# Patient Record
Sex: Female | Born: 1955 | Race: White | Hispanic: No | Marital: Single
Health system: Southern US, Community
[De-identification: ages and names within clinical notes are randomized; demographics above are authoritative.]

---

## 2007-05-24 ENCOUNTER — Ambulatory Visit: Payer: Self-pay | Admitting: Unknown Physician Specialty

## 2009-02-19 ENCOUNTER — Ambulatory Visit: Payer: Self-pay | Admitting: Unknown Physician Specialty

## 2009-05-23 ENCOUNTER — Ambulatory Visit: Payer: Self-pay | Admitting: Gastroenterology

## 2009-06-17 ENCOUNTER — Ambulatory Visit: Payer: Self-pay | Admitting: Gastroenterology

## 2010-04-20 ENCOUNTER — Ambulatory Visit: Payer: Self-pay | Admitting: Unknown Physician Specialty

## 2011-05-13 ENCOUNTER — Ambulatory Visit: Payer: Self-pay | Admitting: Unknown Physician Specialty

## 2011-05-25 ENCOUNTER — Ambulatory Visit: Payer: Self-pay | Admitting: Unknown Physician Specialty

## 2011-06-28 ENCOUNTER — Ambulatory Visit: Payer: Self-pay | Admitting: Surgery

## 2011-07-14 ENCOUNTER — Ambulatory Visit: Payer: Self-pay | Admitting: Surgery

## 2011-07-20 ENCOUNTER — Ambulatory Visit: Payer: Self-pay | Admitting: Surgery

## 2011-07-21 LAB — PATHOLOGY REPORT

## 2012-05-17 ENCOUNTER — Ambulatory Visit: Payer: Self-pay | Admitting: Gastroenterology

## 2012-07-04 ENCOUNTER — Ambulatory Visit: Payer: Self-pay | Admitting: Gastroenterology

## 2012-07-22 IMAGING — MG MMM DGTL SCREENING MAMMO W/CAD
1 series · 5 of 5 positions shown · non-contrast
Comparison: none

REASON FOR EXAM: SCR MAMMO
COMMENTS:

[Series 204: R CC · right · 5 of 5 slices shown]
[im 1/5]
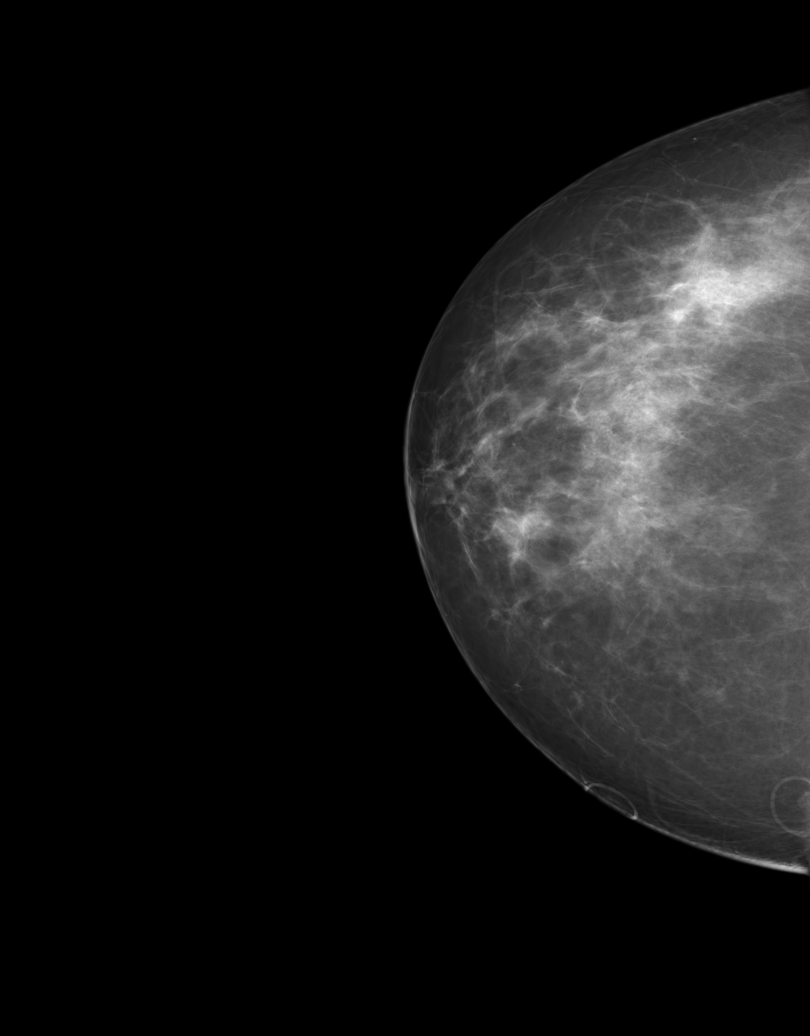
[im 2/5]
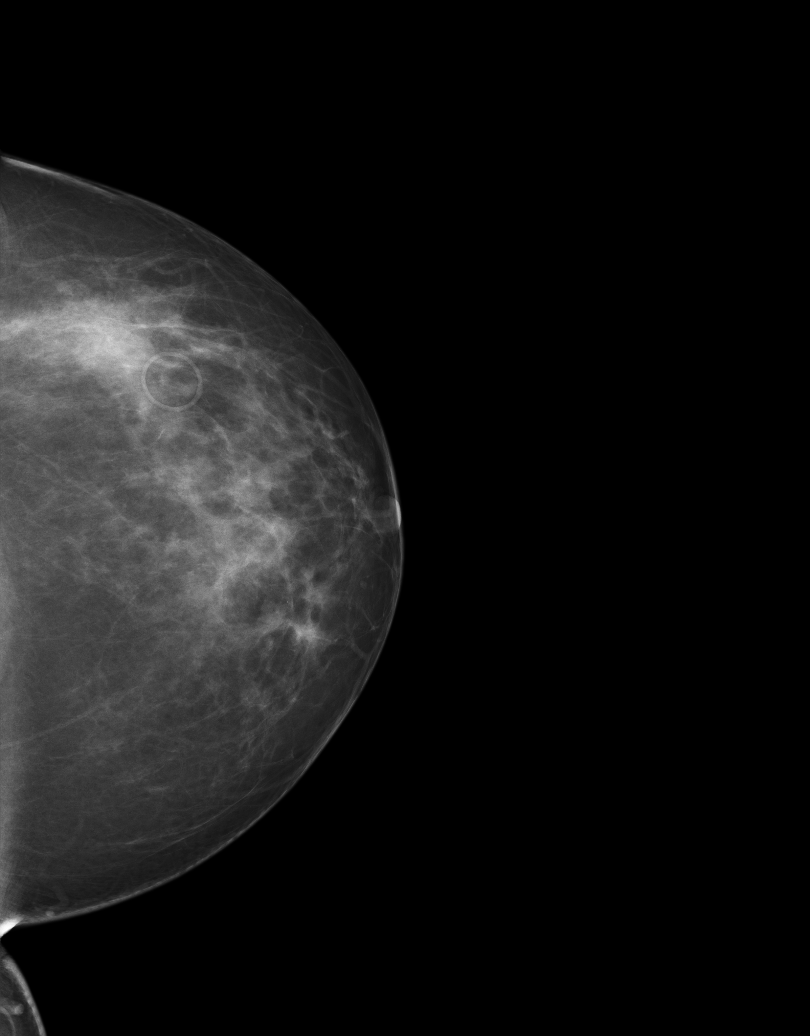
[im 3/5]
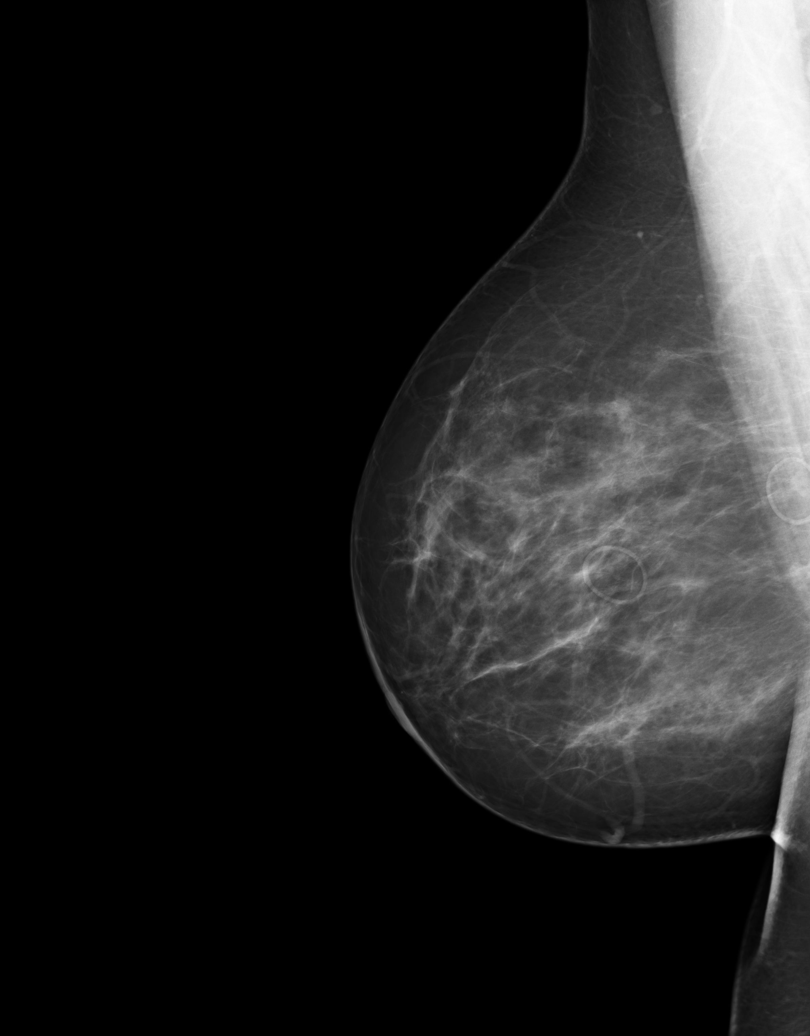
[im 4/5]
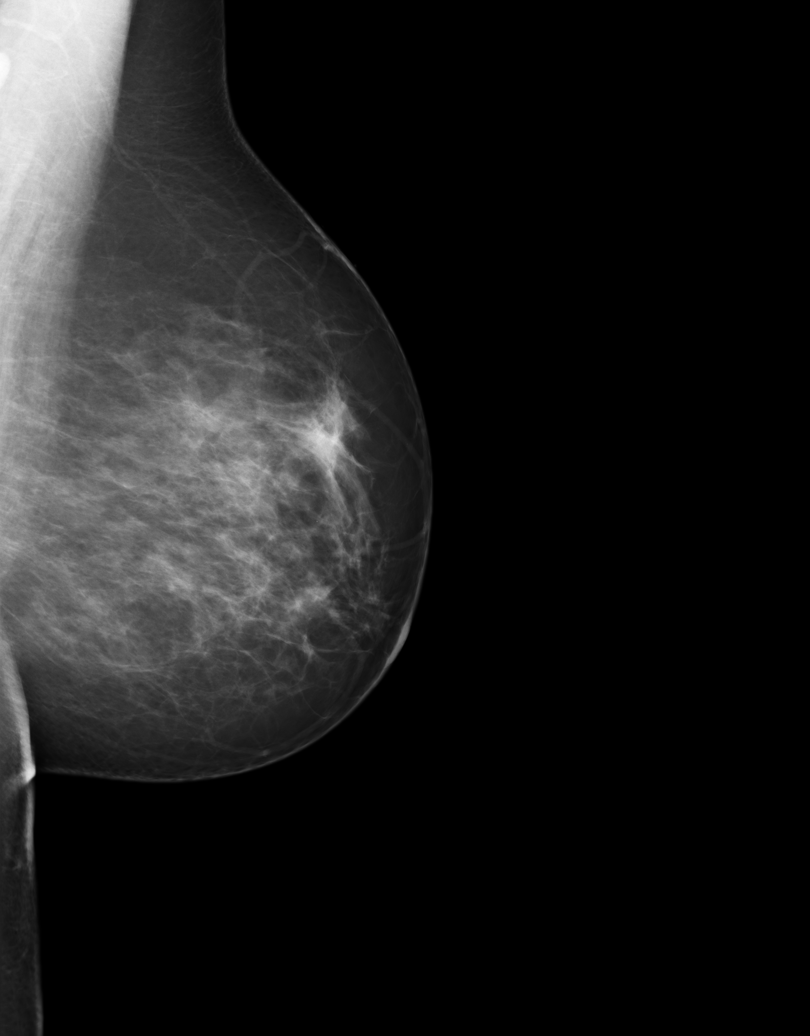
[im 5/5]
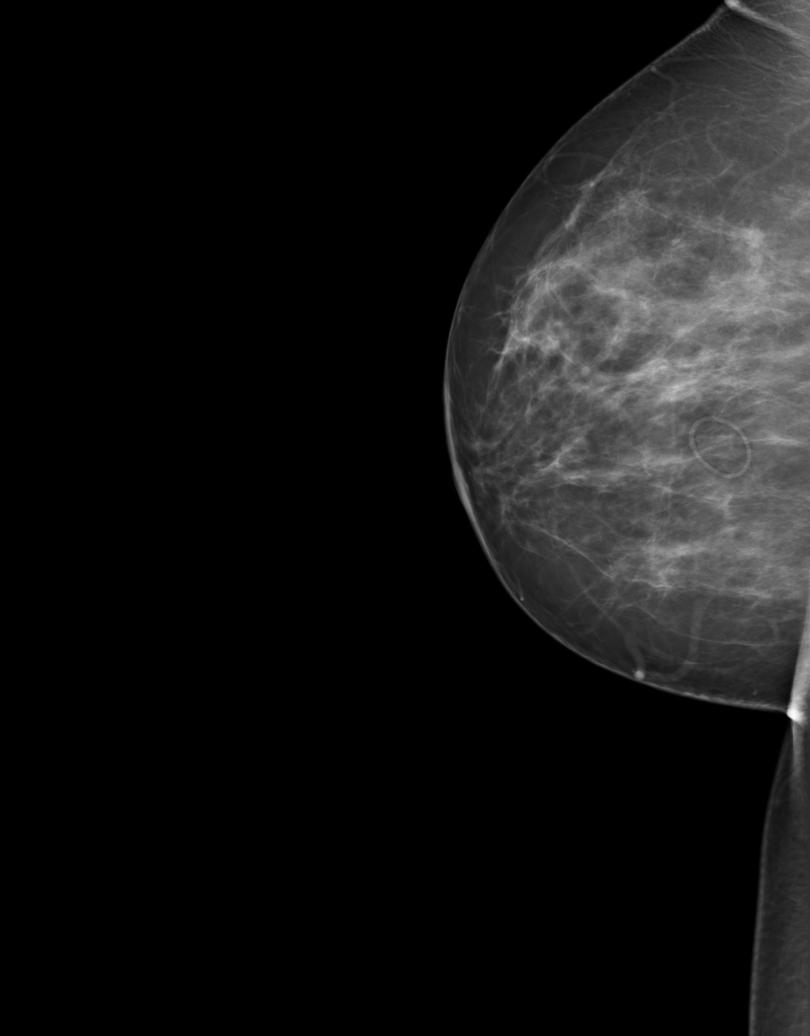

[5 of 5 positions shown; findings below may reference images not displayed]

PROCEDURE:     MMM - MMM DGTL SCREENING MAMMO W/CAD  - May 13, 2011  [DATE]

RESULT:     Comparison is made to prior examinations of 04/20/2010, 02/19/2009
and 05/24/2007.

There is a focal asymmetry superiorly in the right breast approximately 11
cm from the nipple. A few, faint calcifications are also seen in the region.
A corresponding focus is not seen in the CC view. There are; however, noted
a few calcifications laterally in the CC view of the right breast. It is
recommended the patient return for spot magnification views of the upper
outer portion of the right breast. Additionally, an ML view and a spot
magnification ML view is recommended. No new mass or calcification is
identified on the left.
IMPRESSION: 1.  There is noted asymmetric density superiorly in the right breast for
which additional imaging is recommended as noted above.
2.  BI-RADS: Category 0 - Needs Additional Imaging Evaluation (Needs
additional imaging evaluation of the right breast).

Thank you for this opportunity to contribute to the care of your patient.

A NEGATIVE MAMMOGRAM REPORT DOES NOT PRECLUDE BIOPSY OR OTHER EVALUATION OF
A CLINICALLY PALPABLE OR OTHERWISE SUSPICIOUS MASS OR LESION. BREAST CANCER
MAY NOT BE DETECTED BY MAMMOGRAPHY IN UP TO 10% OF CASES.

## 2012-08-18 ENCOUNTER — Ambulatory Visit: Payer: Self-pay | Admitting: Unknown Physician Specialty

## 2013-06-04 ENCOUNTER — Ambulatory Visit: Payer: Self-pay | Admitting: Gastroenterology

## 2013-11-14 ENCOUNTER — Ambulatory Visit: Payer: Self-pay | Admitting: Gastroenterology

## 2013-12-17 ENCOUNTER — Ambulatory Visit: Payer: Self-pay | Admitting: Unknown Physician Specialty

## 2013-12-25 ENCOUNTER — Ambulatory Visit: Payer: Self-pay | Admitting: Unknown Physician Specialty

## 2013-12-31 ENCOUNTER — Ambulatory Visit: Payer: Self-pay | Admitting: Unknown Physician Specialty

## 2014-01-03 ENCOUNTER — Ambulatory Visit: Payer: Self-pay | Admitting: Gastroenterology

## 2014-01-03 LAB — CREATININE, SERUM
Creatinine: 0.95 mg/dL (ref 0.60–1.30)
EGFR (African American): >60

## 2014-01-08 LAB — PATHOLOGY REPORT

## 2014-01-21 ENCOUNTER — Ambulatory Visit: Payer: Self-pay | Admitting: Hematology and Oncology

## 2014-01-24 ENCOUNTER — Ambulatory Visit: Payer: Self-pay | Admitting: Surgery

## 2014-01-29 ENCOUNTER — Ambulatory Visit: Payer: Self-pay | Admitting: Gastroenterology

## 2014-02-01 ENCOUNTER — Ambulatory Visit: Payer: Self-pay | Admitting: Surgery

## 2014-02-04 LAB — PATHOLOGY REPORT

## 2014-02-11 LAB — COMPREHENSIVE METABOLIC PANEL
AST: 31 U/L (ref 15–37)
Albumin: 3.6 g/dL (ref 3.4–5.0)
Alkaline Phosphatase: 136 U/L — ABNORMAL HIGH
Anion Gap: 11 (ref 7–16)
BILIRUBIN TOTAL: 0.2 mg/dL (ref 0.2–1.0)
BUN: 8 mg/dL (ref 7–18)
Calcium, Total: 9.1 mg/dL (ref 8.5–10.1)
Chloride: 98 mmol/L (ref 98–107)
Co2: 29 mmol/L (ref 21–32)
Creatinine: 0.84 mg/dL (ref 0.60–1.30)
EGFR (African American): >60
Glucose: 177 mg/dL — ABNORMAL HIGH (ref 65–99)
OSMOLALITY: 278 (ref 275–301)
Potassium: 4.3 mmol/L (ref 3.5–5.1)
SGPT (ALT): 41 U/L (ref 12–78)
Sodium: 138 mmol/L (ref 136–145)
Total Protein: 8.4 g/dL — ABNORMAL HIGH (ref 6.4–8.2)

## 2014-02-11 LAB — CBC CANCER CENTER
BASOS PCT: 1.2 %
Basophil #: 0.1 x10 3/mm (ref 0.0–0.1)
Eosinophil #: 0.2 x10 3/mm (ref 0.0–0.7)
Eosinophil %: 3 %
HCT: 33.5 % — ABNORMAL LOW (ref 35.0–47.0)
HGB: 10.7 g/dL — ABNORMAL LOW (ref 12.0–16.0)
Lymphocyte #: 2.3 x10 3/mm (ref 1.0–3.6)
Lymphocyte %: 29.7 %
MCH: 27.4 pg (ref 26.0–34.0)
MCHC: 32.1 g/dL (ref 32.0–36.0)
MCV: 85 fL (ref 80–100)
Monocyte #: 0.5 x10 3/mm (ref 0.2–0.9)
Monocyte %: 6.2 %
NEUTROS ABS: 4.5 x10 3/mm (ref 1.4–6.5)
Neutrophil %: 59.9 %
Platelet: 234 x10 3/mm (ref 150–440)
RBC: 3.93 10*6/uL (ref 3.80–5.20)
RDW: 14.4 % (ref 11.5–14.5)
WBC: 7.6 x10 3/mm (ref 3.6–11.0)

## 2014-02-17 ENCOUNTER — Ambulatory Visit: Payer: Self-pay | Admitting: Hematology and Oncology

## 2014-05-06 ENCOUNTER — Ambulatory Visit: Payer: Self-pay | Admitting: Hematology and Oncology

## 2014-05-21 ENCOUNTER — Ambulatory Visit: Payer: Self-pay | Admitting: Hematology and Oncology

## 2014-06-19 ENCOUNTER — Ambulatory Visit: Payer: Self-pay | Admitting: Hematology and Oncology

## 2014-07-20 ENCOUNTER — Ambulatory Visit: Payer: Self-pay | Admitting: Hematology and Oncology

## 2015-02-26 IMAGING — MG MMM DGTL SCREENING MAMMO W/CAD
1 series · 4 of 4 positions shown · non-contrast
Comparison: Previous Exam(s)

CLINICAL DATA: Screening.

EXAM:
DIGITAL SCREENING BILATERAL MAMMOGRAM WITH CAD

[R CC · right · 4 of 4 slices shown]
[im 1/4]
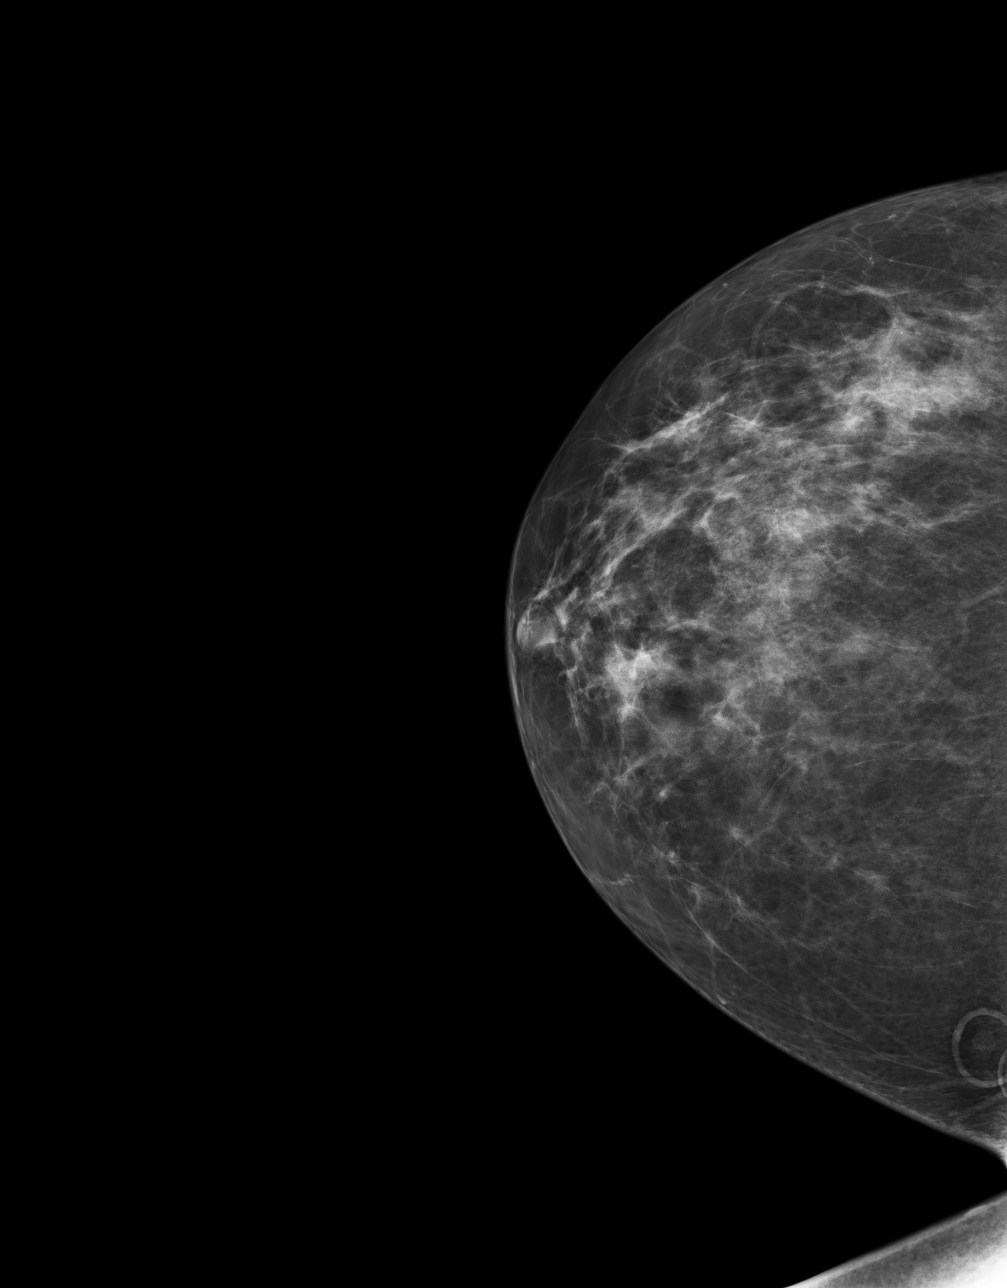
[im 2/4]
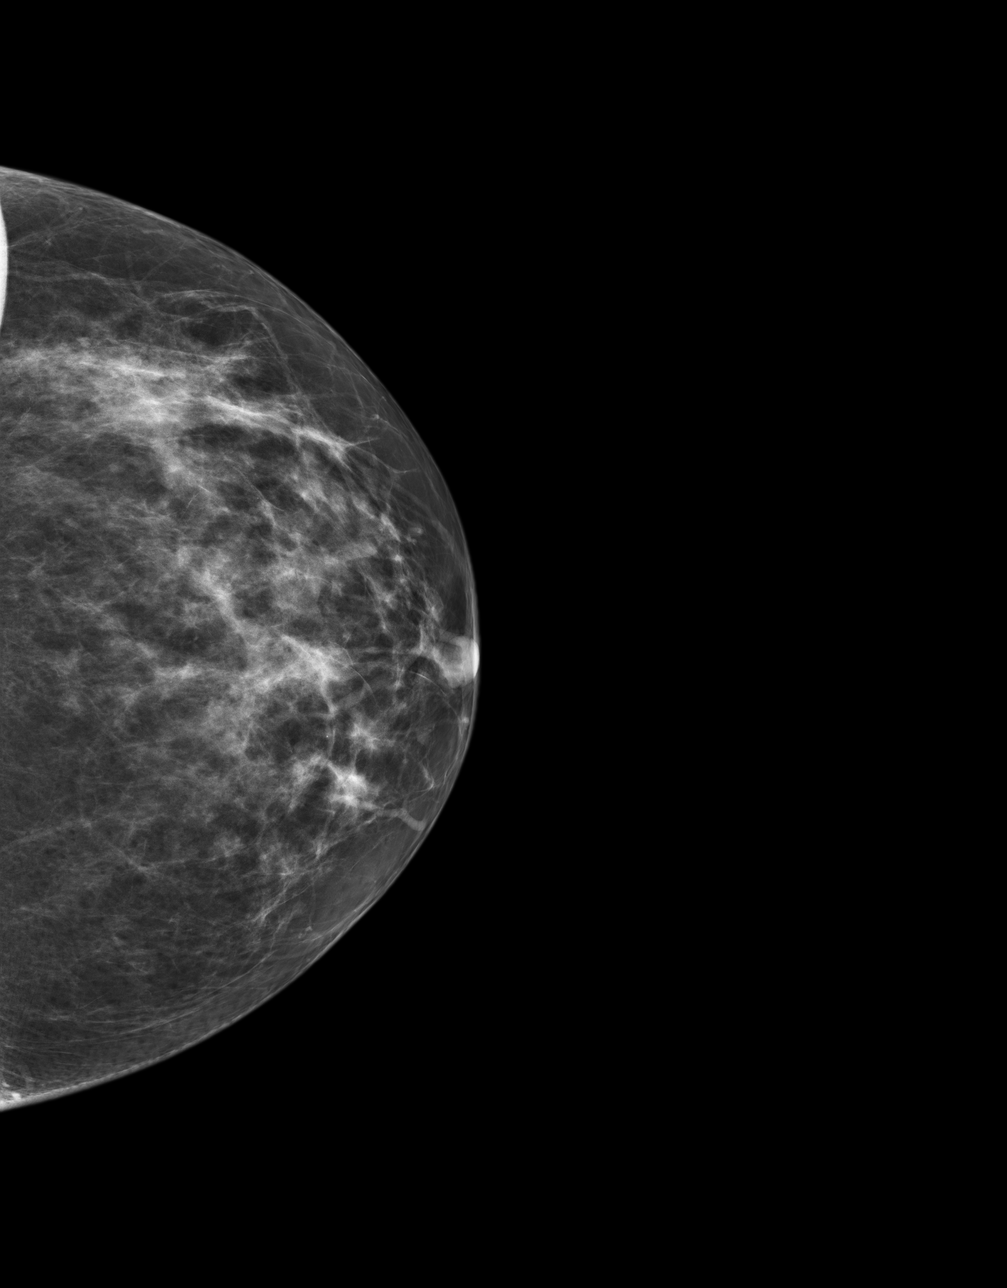
[im 3/4]
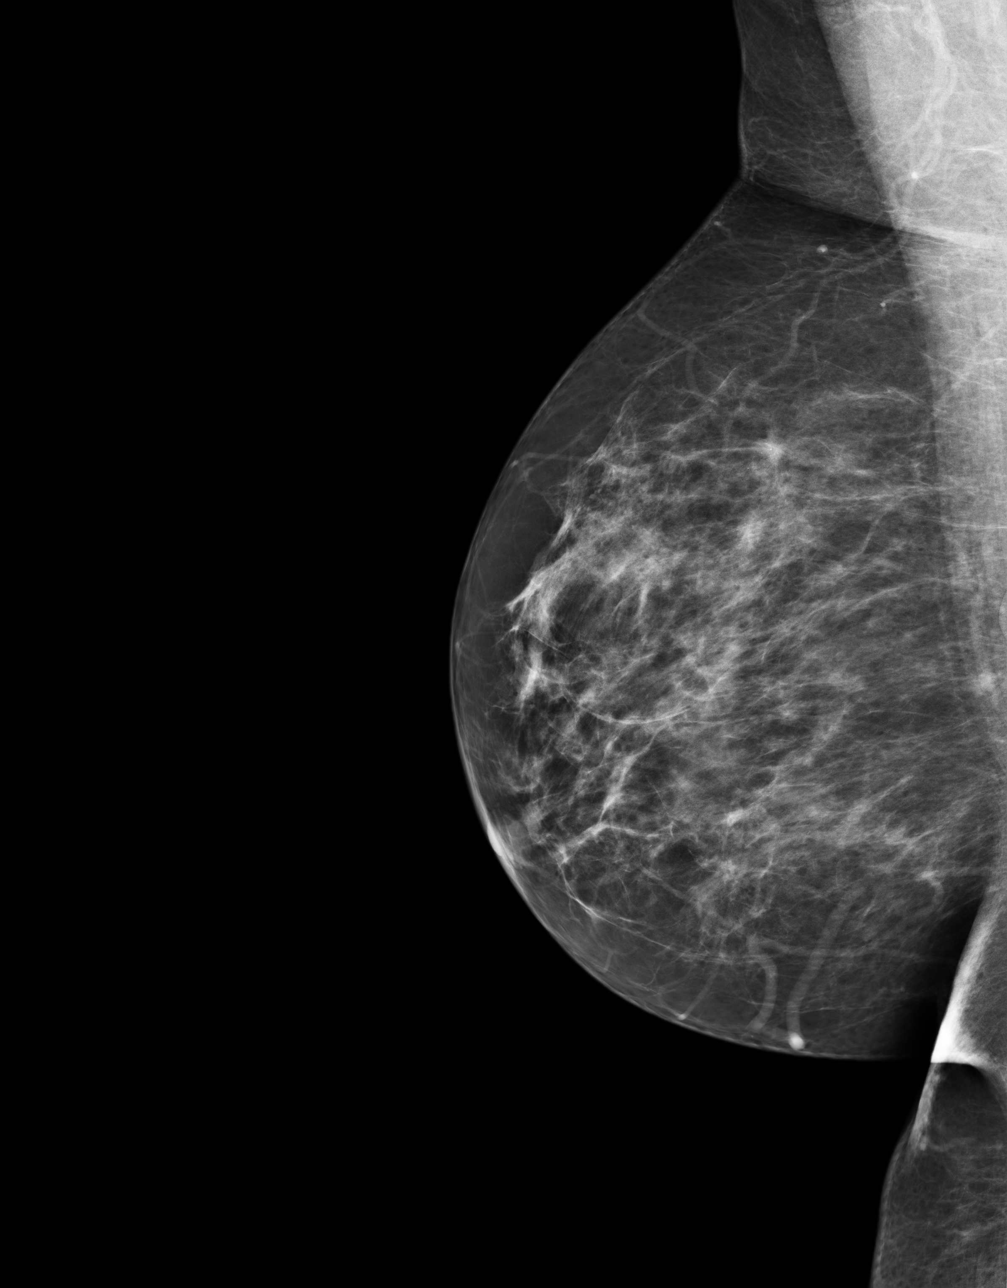
[im 4/4]
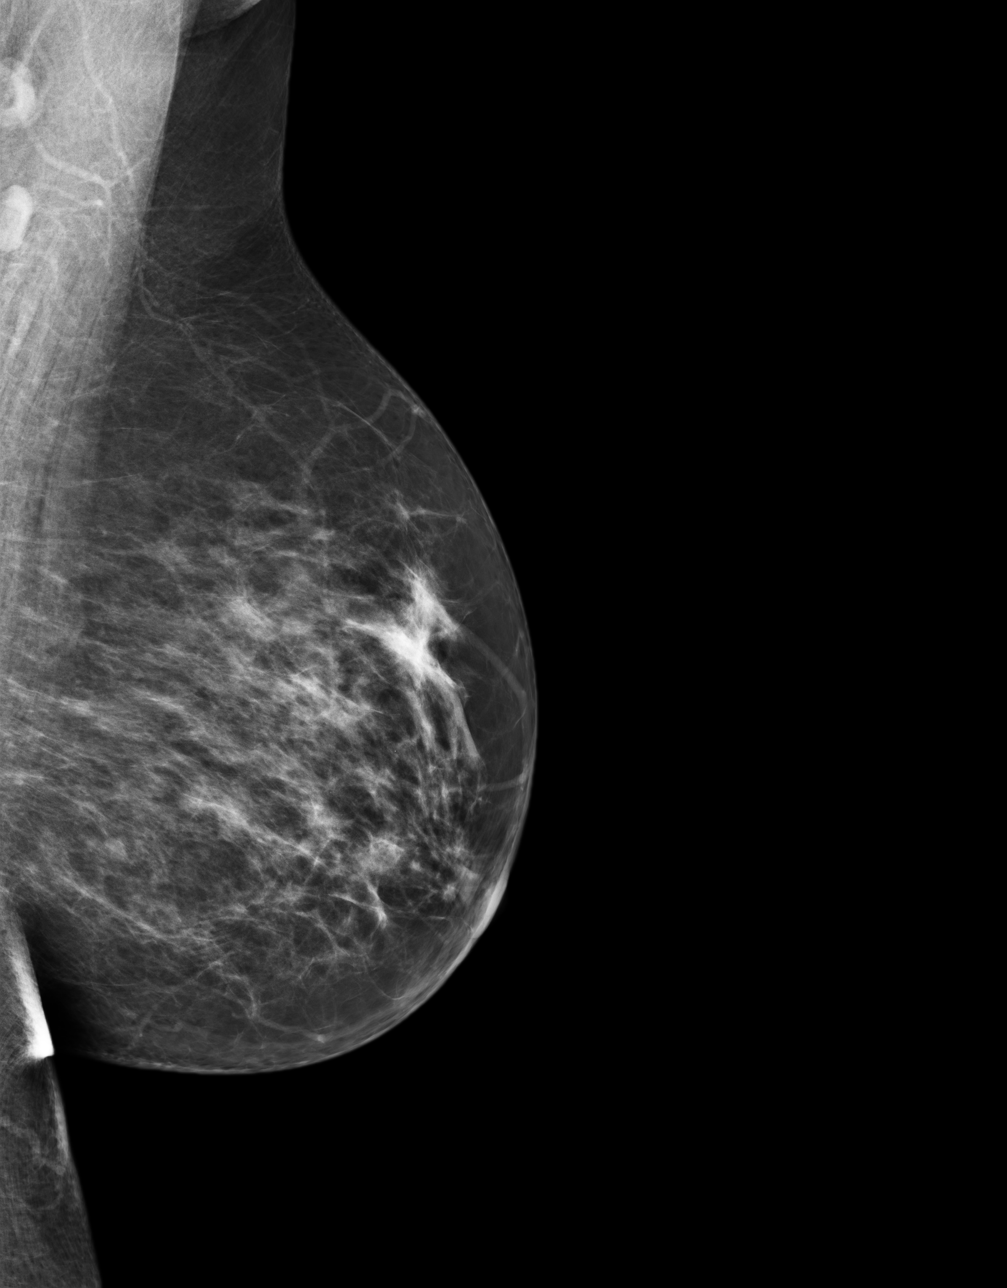

[4 of 4 positions shown; findings below may reference images not displayed]

ACR Breast Density Category c: The breasts are heterogeneously
dense, which may obscure small masses.
FINDINGS: In the right breast, calcifications warrant further evaluation with
magnified views. In the left breast, no mass or malignant type
calcifications are identified. Images were processed with CAD.
IMPRESSION: Further evaluation is suggested for calcifications in the right
breast.

RECOMMENDATION:
Diagnostic mammogram of the right breast. (Code:[id number]-H-11N)

The patient will be contacted regarding the findings, and additional
imaging will be scheduled.

BI-RADS CATEGORY  0: Incomplete. Need additional imaging evaluation
and/or prior mammograms for comparison.

## 2015-04-12 NOTE — Consult Note (Signed)
Reason for Visit: This 59 year old Female patient presents to the clinic for initial evaluation of  breast cancer .   Referred by Dr. Kallie Edward.  Diagnosis:  Chief Complaint/Diagnosis   59 year old female with stage I (T1 C. 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 Stereotactic biopsy was recommended and patient underwent biopsy-positive for invasive mammary carcinoma. She went for a wide local excision for 1.5 cm invasive mammary carcinoma grade 1 ER/PR positive HER-2/neu is not overexpressed. Margins were clear 8 mm. No angiolymphatic invasion was noted. Patient had adamantly refused systemic chemotherapy so Oncotype DX was not performed. She seen today for consideration of radiation therapy. She is doing well. She's without complaints. She specifically denies breast tenderness cough or bone pain. Patient has multiple comorbidities including diabetic neuropathy bipolar disorder substance abuse depression and hypertension.  Past Hx:    Mild intermittent asthma:    Breast Cancer:    Diabetic Neuropathy:    Bipolar:    H/o substance abuse:    Diverticula of colon:    Depression:    Esophageal Reflux - GERD:    Degenerative Disc Disease:    Fibromyalgia:    hypothyroidism:    Diabetes Mellitus, Type II (NIDD):    Hypertension:    Psoriasis:    Hiatal hernia:    Hepatitis C:    Emphysema:    COPD:    sleep apnea/ CPAP:    cardiac ablation:    Fibromyalgia:    ADHD:    Right knee  arthroscopy:    tubal ligation:    gall bladder surgery:    Cesarean Section:   Past, Family and Social History:  Past Medical History positive   Respiratory asthma   Gastrointestinal diverticulitis; GERD; hiatal hernia   Endocrine diabetes mellitus; hypothyroidism   Neurological/Psychiatric depression; bipolar disorder   Immunologic fibromyalgia; hepatitis C   Past Surgical History cholecystectomy; right knee arthroscopic, tubal ligation, C-section   Past Medical History Comments DJD, fibromyalgia, psoriasis   Family History positive   Family History Comments Sr. with colon cancer and mother with breast cancer   Social History positive   Social History Comments greater than 40-pack-year smoking history, no EtOH use history   Additional Past Medical and Surgical History seen by herself today   Allergies:   Codeine: N/V/Diarrhea  Zoloft: GI Distress  Tramadol: Headaches, N/V/Diarrhea  Cymbalta: Insomnia  Lyrica: Swelling  Depakote: Unknown  Concerta: Unknown  Home Meds:  Home Medications: Medication Instructions Status  Qvar 80 mcg/inh inhalation aerosol 2 puff(s) inhaled 2 times a day, As Needed Active  Fish Oil 1000 mg oral capsule 1 cap(s) orally once a day (in the morning) Active  polyethylene glycol 3350 1 dose(s) orally , As Needed Active  Vitamin B Complex 100 1 tab(s) orally once a day (in the morning) Active  omeprazole 20 mg oral delayed release tablet 1 cap(s) orally 2 times a day, As Needed Active  valacyclovir 500 mg oral tablet 1 tab(s) orally 2 times a day, As Needed Active  calcium 1200 mg + D 1 tab(s)  orally once a day (in the morning) Active  hydrochlorothiazide-lisinopril 12.5 mg-20 mg oral tablet 2 tab(s) orally once a day Active  glipiZIDE-metFORMIN 2.5 mg-500 mg oral tablet 2 tab(s) orally 2 times a day Active  levothyroxine 200 mcg (0.2 mg) oral tablet 1 tab(s) orally once a day on Mon Wed Fri Active  levothyroxine 175 mcg (0.175 mg)  oral tablet 1 tab(s) orally once a day on Tues, Thurs, Sat , Sun Active  multivitamin with iron 1 tab(s) orally once a day Active  Suboxone 8 mg-2 mg sublingual film 2 each sublingual once a day Active  albuterol vial for nebulizer 0.63% 1   4 times a day, As Needed - for Shortness of Breath or bad cold Active  promethazine 25 mg oral tablet 1 tab(s) orally 2 times a day, As Needed Active  biotin 1000 mcg oral tablet 1 tab(s) orally once a day Active  Vitamin D3 2000 intl units oral capsule 1 cap(s) orally once a day Active  Bio coffee  orally 2 times a day Active  Ginkgo - oral capsule 1000 milligram(s) orally 2 times a day Active   Review of Systems:  General negative   Performance Status (ECOG) 0   Skin negative   Breast see HPI   Ophthalmologic negative   ENMT negative   Respiratory and Thorax negative   Cardiovascular negative   Gastrointestinal negative   Genitourinary negative   Musculoskeletal negative   Neurological negative   Psychiatric see HPI   Hematology/Lymphatics negative   Endocrine negative   Allergic/Immunologic negative   Review of Systems   review of systems obtained from nurses notes  Nursing Notes:  Nursing Vital Signs and Chemo Nursing Nursing Notes: *CC Vital Signs Flowsheet:   03-Jun-15 14:09  Temp Temperature 98.3  Pulse Pulse 84  Respirations Respirations 20  SBP SBP 129  DBP DBP 73  Pain Scale (0-10)  0  Current Weight (kg) (kg) 79.8   Physical Exam:  General/Skin/HEENT:  General normal   Skin normal   Eyes normal   ENMT normal   Head and Neck normal   Additional PE well-developed female in NAD. Lungs are clear to A&P cardiac examination shows regular rate and rhythm. Right breast has a well-healed lumpectomy scar. No dominant mass or nodularity is noted in either breast in 2 positions examined. No axillary or supraclavicular adenopathy is appreciated.   Breasts/Resp/CV/GI/GU:  Respiratory and Thorax normal    Cardiovascular normal   Gastrointestinal normal   Genitourinary normal   MS/Neuro/Psych/Lymph:  Musculoskeletal normal   Neurological normal   Lymphatics normal   Other Results:  Radiology Results: LabUnknown:    06-Jan-15 14:17, Digital Additional Views Rt Breast (SCR)  PACS Image   [facility]:  Digital Additional Views Rt Breast (SCR)   REASON FOR EXAM:    av rt calcifications  COMMENTS:       PROCEDURE: MAM - MAM DIG ADDVIEWS RT SCR  - Dec 25 2013  2:17PM     CLINICAL DATA:  Calcifications right breast identified on recent  screening mammogram. Patient had and ultrasound-guided wire  localization in 2012 of a 5 mm mass in the right breast 10 o'clock  position. Pathology showed proliferative fibrocystic change.  Negative for atypia or malignancy.    EXAM:  DIGITAL DIAGNOSTIC  RIGHT MAMMOGRAM    COMPARISON:  12/17/2013 and earlier priors  ACR Breast Density Category c: The breast tissue is heterogeneously  dense, which may obscure small masses.  FINDINGS:  Magnification views of the upper outer right breast show a 17 x 11 x  14 mm cluster of predominantly punctate and roundcalcifications.  They do not definitely layer on the 90 degrees lateral view.     IMPRESSION:  Cluster of calcifications in the upper-outer quadrant of the right  breast. While these calcifications could be due to benign etiology  such as fibrocysticchange, ductal carcinoma in situ cannot be  completely excluded.    RECOMMENDATION:  Stereotactic biopsy is suggested to exclude malignancy. The patient  has been scheduled for stereotactic biopsy 12/31/2013 at 2 o'clock  p.m.    I have discussed thefindings and recommendations with the patient.  Results were also provided in writing at the conclusion of the  visit. If applicable, a reminder letter will be sent to the patient  regarding the next appointment.    BI-RADS CATEGORY  4: Suspicious abnormality - biopsy should  be  considered.      Electronically Signed    By: Curlene Dolphin M.D.    On: 12/25/2013 16:35         Verified By: Sheppard Evens, M.D.,   Relevent Results:   Relevant Scans and Labs mammograms are reviewed   Assessment and Plan: Impression:   stage I invasive mammary carcinoma the right breast 59 year old female who adamantly refuses systemic chemotherapy. Plan:   at this time I recommend whole breast radiation to 5000 cGy to her right breast. Would also boost her scar another 1400 cGy using electrons. Risks and benefits of treatment were reviewed with the patient. Side effects including skin reaction, fatigue, possible occlusion of superficial lung, and alteration of blood counts all were explained in detail to the patient. I have set her up for CT simulation early next week. Patient will be a candidate for aromatase inhibitor after completion of radiation and we'll refer her back to medical oncology for that.  I would like to take this opportunity for allowing me to participate in the care of your patient..  CC Referral:  cc: Dr. Tamala Julian   Electronic Signatures: Baruch Gouty, Roda Shutters (MD)  (Signed 03-Jun-15 15:04)  Authored: HPI, Diagnosis, Past Hx, PFSH, Allergies, Home Meds, ROS, Nursing Notes, Physical Exam, Other Results, Relevent Results, Encounter Assessment and Plan, CC Referring Physician   Last Updated: 03-Jun-15 15:04 by Armstead Peaks (MD)

## 2015-04-12 NOTE — Op Note (Signed)
PATIENT NAME:  Madeline Huffman, Madeline Huffman MR#:  [id number] DATE OF BIRTH:  10-18-1956  DATE OF PROCEDURE:  02/01/2014  PREOPERATIVE DIAGNOSIS: Carcinoma of the right breast.   POSTOPERATIVE DIAGNOSIS: Carcinoma of the right breast.   PROCEDURE: Right partial mastectomy with axillary sentinel lymph node biopsy.   SURGEON: Renda RollsWilton Smith, MD  ANESTHESIA: General.   INDICATIONS: This 59 year old female recently had mammogram depicting microcalcifications in the upper outer quadrant of the right breast. She had stereotactic core biopsy demonstrating infiltrating mammary carcinoma. She had preoperative x-ray needle localization and injection of radioactive technetium sulfur colloid.   DESCRIPTION OF PROCEDURE: The patient was placed on the operating table in the supine position under general anesthesia. The dressing was removed from the right breast exposing the Kopans wire, which entered the breast at the 10 o'clock position. The wire was cut 2 cm from the skin. The right arm was placed on a lateral arm support. The right breast was prepared with ChloraPrep and draped in a sterile manner.   Mammogram images were reviewed prior to incision. Next, a curvilinear incision was made from approximately 9:30 to 11:30 position in the upper outer quadrant of the right breast, adjacent to the Kopans wire. The dissection was carried down through subcutaneous tissues and with progressing deeper widened the dissection around the thick portion of the wire palpating some minimal degree of firmness, at the biopsy site, and removed a portion of tissue, which was approximately 5 x 5 x 4 cm in dimension. The 11:30 position of the skin ellipse was tagged with a nylon stitch for the pathologist's orientation. Also margin map markers were sutured to the specimen to mark the medial, lateral, cranial, caudal, and deep margins. This was submitted for specimen mammogram and pathology. The wound was inspected. It is noted that a number of  small bleeding points were cauterized during the course of the procedure and hemostasis was subsequently intact.   Next, attention was turned to the axilla. The gamma counter was used to demonstrate location of radioactivity in the inferior aspect of the axilla. An oblique incision was made some 5 cm in length, carried down through subcutaneous tissues through superficial fascia. Two Weitlaner retractors were used. There was a traversing artery and vein, which were ligated with 4-0 chromic proximally and distally and divided. Dissection was carried out further down into the axilla using the gamma counter for direction and demonstrated the location of a lymph node, which was approximately 8 mm in dimension and was soft and rounded and was dissected free from surrounding structures with a small amount of surrounding fatty tissue. The ex vivo count was greater than 2800 counts per second. The background count was less than 40 counts per second. There was no remaining palpable mass within the axilla. Hemostasis was intact.   Next, attention was turned back to the partial mastectomy wound and determined hemostasis was intact.   The radiologist had called back to report that the biopsy marker was found in the specimen and the pathologist later called back to say that margins appeared to be clear.   Both wounds were infiltrated with 0.5% Sensorcaine with epinephrine, in the subcutaneous tissues. Both wounds were inspected and found that hemostasis was intact. Next, the partial mastectomy wound was closed with interrupted 4-0 chromic subcutaneous sutures. Next, the skin was closed with running 4-0 Monocryl subcuticular suture. Next, the axillary wound was closed with 4-0 Monocryl subcuticular suture. Both wounds were treated with Dermabond. The patient tolerated surgery satisfactorily and  was then prepared for transfer to the recovery room. ____________________________ Shela Commons. Renda Rolls, MD jws:sb D: 02/01/2014  14:36:05 ET T: 02/01/2014 14:46:52 ET JOB#: [id number]  cc: Adella Hare, MD, <Dictator> Adella Hare MD ELECTRONICALLY SIGNED 02/01/2014 20:11

## 2021-11-19 DEATH — deceased
